# Patient Record
Sex: Male | Born: 1985 | Hispanic: No | Marital: Married | Smoking: Current some day smoker
Health system: Southern US, Community
[De-identification: ages and names within clinical notes are randomized; demographics above are authoritative.]

---

## 2015-01-17 ENCOUNTER — Emergency Department (HOSPITAL_COMMUNITY)
Admission: EM | Admit: 2015-01-17 | Discharge: 2015-01-17 | Disposition: A | Discharge status: Home / Self Care | Payer: PPO | Attending: Emergency Medicine | Admitting: Emergency Medicine

## 2015-01-17 ENCOUNTER — Emergency Department (HOSPITAL_COMMUNITY): Payer: PPO

## 2015-01-17 ENCOUNTER — Encounter (HOSPITAL_COMMUNITY): Payer: Self-pay | Admitting: Cardiology

## 2015-01-17 DIAGNOSIS — S60222A Contusion of left hand, initial encounter: Secondary | ICD-10-CM | POA: Diagnosis not present

## 2015-01-17 DIAGNOSIS — Y9366 Activity, soccer: Secondary | ICD-10-CM | POA: Diagnosis not present

## 2015-01-17 DIAGNOSIS — Y999 Unspecified external cause status: Secondary | ICD-10-CM | POA: Diagnosis not present

## 2015-01-17 DIAGNOSIS — W1839XA Other fall on same level, initial encounter: Secondary | ICD-10-CM | POA: Insufficient documentation

## 2015-01-17 DIAGNOSIS — Z720 Tobacco use: Secondary | ICD-10-CM | POA: Insufficient documentation

## 2015-01-17 DIAGNOSIS — S6992XA Unspecified injury of left wrist, hand and finger(s), initial encounter: Secondary | ICD-10-CM | POA: Diagnosis present

## 2015-01-17 DIAGNOSIS — Y92322 Soccer field as the place of occurrence of the external cause: Secondary | ICD-10-CM | POA: Diagnosis not present

## 2015-01-17 DIAGNOSIS — S62309A Unspecified fracture of unspecified metacarpal bone, initial encounter for closed fracture: Secondary | ICD-10-CM

## 2015-01-17 DIAGNOSIS — S62335A Displaced fracture of neck of fourth metacarpal bone, left hand, initial encounter for closed fracture: Secondary | ICD-10-CM | POA: Insufficient documentation

## 2015-01-17 DIAGNOSIS — M79642 Pain in left hand: Secondary | ICD-10-CM

## 2015-01-17 MED ORDER — NAPROXEN 500 MG PO TABS: 500.0000 mg | ORAL_TABLET | Freq: Two times a day (BID) | ORAL | Status: AC | PRN

## 2015-01-17 MED ORDER — HYDROCODONE-ACETAMINOPHEN 5-325 MG PO TABS: 1.0000 | ORAL_TABLET | Freq: Four times a day (QID) | ORAL | Status: AC | PRN

## 2015-01-17 MED ORDER — MORPHINE SULFATE (PF) 4 MG/ML IV SOLN
4.0000 mg | Freq: Once | INTRAVENOUS | Status: AC
  Administered 2015-01-17: 4 mg via INTRAMUSCULAR
  Filled 2015-01-17: qty 1

## 2015-01-17 NOTE — ED Provider Notes (Signed)
CSN: [id number]     Arrival date & time 01/17/15  1812 History  By signing my name below, I, Emmanuella Mensah, attest that this documentation has been prepared under the direction and in the presence of Meshelle Holness C. Electronically Signed: Angelene GiovanniEmmanuella Mensah, ED Scribe. 01/17/2015. 6:48 PM.    Chief Complaint  Patient presents with  . Hand Pain   Patient is a 29 y.o. male presenting with hand pain. The history is provided by the patient. No language interpreter was used.  Hand Pain This is a new problem. The current episode started less than 1 hour ago. The problem occurs constantly. The problem has been gradually worsening. The symptoms are aggravated by exertion (L hand movement). Nothing relieves the symptoms. He has tried nothing for the symptoms. The treatment provided no relief.   HPI Comments: Jason Johns is a 29 y.o. male who presents to the Emergency Department status post left hand injury that occurred approximately 30 minutes ago while playing soccer, states he fell on it. He reports constant throbbing,10/10 left hand pain that radiates up his left arm, swelling and bruising of the left hand, and left wrist pain. Pain worsens with movement of L hand, and he has not tried anything PTA for his symptoms. He denies any head injury. History is somewhat difficult due to pt repetitively interrupting to state he's in pain and needs something to help with his pain. He reports NKDA. He denies any numbness or tingling, denies weakness but states it hurts to move his L hand/fingers.   History reviewed. No pertinent past medical history. History reviewed. No pertinent past surgical history. History reviewed. No pertinent family history. Social History  Substance Use Topics  . Smoking status: Current Some Day Smoker  . Smokeless tobacco: None  . Alcohol Use: Yes    Review of Systems  HENT: Negative for facial swelling (no head inj).   Musculoskeletal: Positive for joint swelling and  arthralgias (L hand).  Skin: Positive for color change (bruising L hand). Negative for wound.  Allergic/Immunologic: Negative for immunocompromised state.  Neurological: Negative for weakness and numbness.    Allergies  Review of patient's allergies indicates no known allergies.  Home Medications   Prior to Admission medications   Not on File   BP 126/87 mmHg  Pulse 98  Temp(Src) 99.3 F (37.4 C) (Oral)  Resp 16  SpO2 97% Physical Exam  Constitutional: He is oriented to person, place, and time. Vital signs are normal. He appears well-developed and well-nourished.  Non-toxic appearance. He appears distressed (appears uncomfortable).  Afebrile, nontoxic, appears uncomfortable, repetitively stating he's in pain  HENT:  Head: Normocephalic and atraumatic.  Mouth/Throat: Mucous membranes are normal.  Eyes: Conjunctivae and EOM are normal. Right eye exhibits no discharge. Left eye exhibits no discharge.  Neck: Normal range of motion. Neck supple.  Cardiovascular: Normal rate and intact distal pulses.   Pulmonary/Chest: Effort normal. No respiratory distress.  Abdominal: Normal appearance. He exhibits no distension.  Musculoskeletal:       Left hand: He exhibits decreased range of motion (due to pain), tenderness, bony tenderness and swelling. He exhibits normal capillary refill and no laceration. Normal sensation noted. Decreased strength (due to pain) noted.       Hands: Left hand with bruising and swelling over the 4th and 5th metacarpals, diminished ROM due to pain, tenderness over the 4th and 5th metacarpals as well as the wrist, no forearm tenderness. Strength slightly diminished due to pain, but still able to  wiggle all digits. Sensation grossly intact, distal pulses intact. No skin openings.   Neurological: He is alert and oriented to person, place, and time. He has normal strength. No sensory deficit.  Skin: Skin is warm, dry and intact. Bruising noted. No rash noted.    Psychiatric: He has a normal mood and affect.  Nursing note and vitals reviewed.   ED Course  Procedures (including critical care time) DIAGNOSTIC STUDIES: Oxygen Saturation is 97% on RA, adequate by my interpretation.    COORDINATION OF CARE: 6:43 PM- Pt advised of plan for treatment and pt agrees. Pt will receive pain medication and ice as well as an X-ray.    Imaging Review Dg Wrist Complete Left  01/17/2015  CLINICAL DATA:  Acute left hand pain after falling while playing soccer. Initial encounter. EXAM: LEFT WRIST - COMPLETE 3+ VIEW COMPARISON:  None. FINDINGS: Mildly displaced oblique fracture is seen involving the proximal portion of the fourth metacarpal. This appears to be closed and posttraumatic. No other bony abnormality is noted. Joint spaces are intact. No soft tissue abnormality is noted. IMPRESSION: Mildly displaced proximal fourth metacarpal fracture. Electronically Signed   By: Lupita Raider, M.D.   On: 01/17/2015 19:05   Dg Hand Complete Left  01/17/2015  CLINICAL DATA:  Fall while playing soccer with swelling and pain to left hand. EXAM: LEFT HAND - COMPLETE 3+ VIEW COMPARISON:  None. FINDINGS: There is a minimally displaced oblique fracture through the proximal to mid aspect of the fourth metacarpal. Remainder of the exam is within normal. IMPRESSION: Minimally displaced oblique fracture of the proximal to mid aspect of the fourth metacarpal. Electronically Signed   By: Elberta Fortis M.D.   On: 01/17/2015 19:04   Rosabelle Jupin Camprubi-Soms, PA has personally reviewed and evaluated these images as part of her medical decision-making.   EKG Interpretation None      MDM   Final diagnoses:  Metacarpal bone fracture, closed, initial encounter  Hand pain, left    29 y.o. male here with L hand pain after soccer injury, swelling and bruising noted to dorsum of hand, NVI with soft compartments. No skin injury. Will obtain xrays and give IM morphine for pain since pt is  in severe pain and want to avoid PO meds until we can determine if this is a fracture that would need OR repair. Will reassess shortly.   7:29 PM Xray reveals mildly displaced 4th metacarpal fx, will place in ulnar gutter splint and refer to hand specialist in 3-5 days for ongoing management. Pain meds given. I explained the diagnosis and have given explicit precautions to return to the ER including for any other new or worsening symptoms. The patient understands and accepts the medical plan as it's been dictated and I have answered their questions. Discharge instructions concerning home care and prescriptions have been given. The patient is STABLE and is discharged to home in good condition.   I personally performed the services described in this documentation, which was scribed in my presence. The recorded information has been reviewed and is accurate.  BP 126/87 mmHg  Pulse 98  Temp(Src) 99.3 F (37.4 C) (Oral)  Resp 16  SpO2 97%  Meds ordered this encounter  Medications  . morphine 4 MG/ML injection 4 mg    Sig:   . naproxen (NAPROSYN) 500 MG tablet    Sig: Take 1 tablet (500 mg total) by mouth 2 (two) times daily as needed for mild pain, moderate pain or headache (TAKE  WITH MEALS.).    Dispense:  20 tablet    Refill:  0    Order Specific Question:  Supervising Provider    Answer:  MILLER, BRIAN [[id number]]  . HYDROcodone-acetaminophen (NORCO) 5-325 MG tablet    Sig: Take 1 tablet by mouth every 6 (six) hours as needed for severe pain.    Dispense:  15 tablet    Refill:  0    Order Specific Question:  Supervising Provider    Answer:  Eber Hong [[id number]]     Dayna Alia Camprubi-Soms, PA-C 01/17/15 1929  Donnetta Hutching, MD 01/17/15 [phone]

## 2015-01-17 NOTE — ED Notes (Signed)
Ortho at bedside.

## 2015-01-17 NOTE — ED Notes (Signed)
Pt stable, ambulatory, states understanding of discharge instructions 

## 2015-01-17 NOTE — Progress Notes (Signed)
Orthopedic Tech Progress Note Patient Details:  Jason Johns 03/21/1986 [id number]  Ortho Devices Type of Ortho Device: Ace wrap, Ulna gutter splint Ortho Device/Splint Location: LUE Ortho Device/Splint Interventions: Ordered, Application   Jennye MoccasinHughes, [name] 01/17/2015, 7:48 PM

## 2015-01-17 NOTE — Discharge Instructions (Signed)
Wear wrist splint at all times. Ice and elevate hand throughout the day. Alternate between naprosyn and norco for pain relief. Do not drive or operate machinery with pain medication use. Call hand specialist follow up today or tomorrow to schedule followup appointment in 3-5 days for ongoing management of your hand fracture. Return to the ER for changes or worsening symptoms.    Metacarpal Fracture A metacarpal fracture is a break (fracture) of a bone in the hand. Metacarpals are the bones that extend from your knuckles to your wrist. In each hand, you have five metacarpal bones that connect your fingers and your thumb to your wrist. Some hand fractures have bone pieces that are close together and stable (simple). These fractures may be treated with only a splint or cast. Hand fractures that have many pieces of broken bone (comminuted), unstable bone pieces (displaced), or a bone that breaks through the skin (compound) usually require surgery. CAUSES This injury may be caused by:  A fall.  A hard, direct hit to your hand.  An injury that squeezes your knuckle, stretches your finger out of place, or crushes your hand. RISK FACTORS This injury is more likely to occur if:  You play contact sports.  You have certain bone diseases. SYMPTOMS  Symptoms of this type of fracture develop soon after the injury. Symptoms may include:  Swelling.  Pain.  Stiffness.  Increased pain with movement.  Bruising.  Inability to move a finger.  A shortened finger.  A finger knuckle that looks sunken in.  Unusual appearance of the hand or finger (deformity). DIAGNOSIS  This injury may be diagnosed based on your signs and symptoms, especially if you had a recent hand injury. Your health care provider will perform a physical exam. He or she may also order X-rays to confirm the diagnosis.  TREATMENT  Treatment for this injury depends on the type of fracture you have and how severe it is. Possible  treatments include:  Non-reduction. This can be done if the bone does not need to be moved back into place. The fracture can be casted or splinted as it is.   Closed reduction. If your bone is stable and can be moved back into place, you may only need to wear a cast or splint or have buddy taping.  Closed reduction with internal fixation (CRIF). This is the most common treatment. You may have this procedure if your bone can be moved back into place but needs more support. Wires, pins, or screws may be inserted through your skin to stabilize the fracture.  Open reduction with internal fixation (ORIF). This may be needed if your fracture is severe and unstable. It involves surgery to move your bone back into the right position. Screws, wires, or plates are used to stabilize the fracture. After all procedures, you may need to wear a cast or a splint for several weeks. You will also need to have follow-up X-rays to make sure that the bone is healing well and staying in position. After you no longer need your cast or splint, you may need physical therapy. This will help you to regain full movement and strength in your hand.  HOME CARE INSTRUCTIONS  If You Have a Cast:  Do not stick anything inside the cast to scratch your skin. Doing that increases your risk of infection.  Check the skin around the cast every day. Report any concerns to your health care provider. You may put lotion on dry skin around the edges  of the cast. Do not apply lotion to the skin underneath the cast. If You Have a Splint:  Wear it as directed by your health care provider. Remove it only as directed by your health care provider.  Loosen the splint if your fingers become numb and tingle, or if they turn cold and blue. Bathing  Cover the cast or splint with a watertight plastic bag to protect it from water while you take a bath or a shower. Do not let the cast or splint get wet. Managing Pain, Stiffness, and Swelling  If  directed, apply ice to the injured area (if you have a splint, not a cast):  Put ice in a plastic bag.  Place a towel between your skin and the bag.  Leave the ice on for 20 minutes, 2-3 times a day.  Move your fingers often to avoid stiffness and to lessen swelling.  Raise the injured area above the level of your heart while you are sitting or lying down. Driving  Do not drive or operate heavy machinery while taking pain medicine.  Do not drive while wearing a cast or splint on a hand that you use for driving. Activity  Return to your normal activities as directed by your health care provider. Ask your health care provider what activities are safe for you. General Instructions  Do not put pressure on any part of the cast or splint until it is fully hardened. This may take several hours.  Keep the cast or splint clean and dry.  Do not use any tobacco products, including cigarettes, chewing tobacco, or electronic cigarettes. Tobacco can delay bone healing. If you need help quitting, ask your health care provider.  Take medicines only as directed by your health care provider.  Keep all follow-up visits as directed by your health care provider. This is important. SEEK MEDICAL CARE IF:   Your pain is getting worse.  You have redness, swelling, or pain in the injured area.   You have fluid, blood, or pus coming from under your cast or splint.   You notice a bad smell coming from under your cast or splint.   You have a fever.  SEEK IMMEDIATE MEDICAL CARE IF:   You develop a rash.   You have trouble breathing.   Your skin or nails on your injured hand turn blue or gray even after you loosen your splint.  Your injured hand feels cold or becomes numb even after you loosen your splint.   You develop severe pain under the cast or in your hand.   This information is not intended to replace advice given to you by your health care provider. Make sure you discuss any  questions you have with your health care provider.   Document Released: 03/15/2005 Document Revised: 12/04/2014 Document Reviewed: 01/02/2014 Elsevier Interactive Patient Education 2016 Else[employer]  Cryotherapy Cryotherapy means treatment with cold. Ice or gel packs can be used to reduce both pain and swelling. Ice is the most helpful within the first 24 to 48 hours after an injury or flare-up from overusing a muscle or joint. Sprains, strains, spasms, burning pain, shooting pain, and aches can all be eased with ice. Ice can also be used when recovering from surgery. Ice is effective, has very few side effects, and is safe for most people to use. PRECAUTIONS  Ice is not a safe treatment option for people with:  Raynaud phenomenon. This is a condition affecting small blood vessels in the extremities. Exposure  to cold may cause your problems to return.  Cold hypersensitivity. There are many forms of cold hypersensitivity, including:  Cold urticaria. Red, itchy hives appear on the skin when the tissues begin to warm after being iced.  Cold erythema. This is a red, itchy rash caused by exposure to cold.  Cold hemoglobinuria. Red blood cells break down when the tissues begin to warm after being iced. The hemoglobin that carry oxygen are passed into the urine because they cannot combine with blood proteins fast enough.  Numbness or altered sensitivity in the area being iced. If you have any of the following conditions, do not use ice until you have discussed cryotherapy with your caregiver:  Heart conditions, such as arrhythmia, angina, or chronic heart disease.  High blood pressure.  Healing wounds or open skin in the area being iced.  Current infections.  Rheumatoid arthritis.  Poor circulation.  Diabetes. Ice slows the blood flow in the region it is applied. This is beneficial when trying to stop inflamed tissues from spreading irritating chemicals to surrounding tissues. However,  if you expose your skin to cold temperatures for too long or without the proper protection, you can damage your skin or nerves. Watch for signs of skin damage due to cold. HOME CARE INSTRUCTIONS Follow these tips to use ice and cold packs safely.  Place a dry or damp towel between the ice and skin. A damp towel will cool the skin more quickly, so you may need to shorten the time that the ice is used.  For a more rapid response, add gentle compression to the ice.  Ice for no more than 10 to 20 minutes at a time. The bonier the area you are icing, the less time it will take to get the benefits of ice.  Check your skin after 5 minutes to make sure there are no signs of a poor response to cold or skin damage.  Rest 20 minutes or more between uses.  Once your skin is numb, you can end your treatment. You can test numbness by very lightly touching your skin. The touch should be so light that you do not see the skin dimple from the pressure of your fingertip. When using ice, most people will feel these normal sensations in this order: cold, burning, aching, and numbness.  Do not use ice on someone who cannot communicate their responses to pain, such as small children or people with dementia. HOW TO MAKE AN ICE PACK Ice packs are the most common way to use ice therapy. Other methods include ice massage, ice baths, and cryosprays. Muscle creams that cause a cold, tingly feeling do not offer the same benefits that ice offers and should not be used as a substitute unless recommended by your caregiver. To make an ice pack, do one of the following:  Place crushed ice or a bag of frozen vegetables in a sealable plastic bag. Squeeze out the excess air. Place this bag inside another plastic bag. Slide the bag into a pillowcase or place a damp towel between your skin and the bag.  Mix 3 parts water with 1 part rubbing alcohol. Freeze the mixture in a sealable plastic bag. When you remove the mixture from the  freezer, it will be slushy. Squeeze out the excess air. Place this bag inside another plastic bag. Slide the bag into a pillowcase or place a damp towel between your skin and the bag. SEEK MEDICAL CARE IF:  You develop white spots on your  skin. This may give the skin a blotchy (mottled) appearance.  Your skin turns blue or pale.  Your skin becomes waxy or hard.  Your swelling gets worse. MAKE SURE YOU:   Understand these instructions.  Will watch your condition.  Will get help right away if you are not doing well or get worse.   This information is not intended to replace advice given to you by your health care provider. Make sure you discuss any questions you have with your health care provider.   Document Released: 11/09/2010 Document Revised: 04/05/2014 Document Reviewed: 11/09/2010 Elsevier Interactive Patient Education 2016 Else[employer] Inc.  Cast or Splint Care Casts and splints support injured limbs and keep bones from moving while they heal.  HOME CARE  Keep the cast or splint uncovered during the drying period.  A plaster cast can take 24 to 48 hours to dry.  A fiberglass cast will dry in less than 1 hour.  Do not rest the cast on anything harder than a pillow for 24 hours.  Do not put weight on your injured limb. Do not put pressure on the cast. Wait for your doctor's approval.  Keep the cast or splint dry.  Cover the cast or splint with a plastic bag during baths or wet weather.  If you have a cast over your chest and belly (trunk), take sponge baths until the cast is taken off.  If your cast gets wet, dry it with a towel or blow dryer. Use the cool setting on the blow dryer.  Keep your cast or splint clean. Wash a dirty cast with a damp cloth.  Do not put any objects under your cast or splint.  Do not scratch the skin under the cast with an object. If itching is a problem, use a blow dryer on a cool setting over the itchy area.  Do not trim or cut your  cast.  Do not take out the padding from inside your cast.  Exercise your joints near the cast as told by your doctor.  Raise (elevate) your injured limb on 1 or 2 pillows for the first 1 to 3 days. GET HELP IF:  Your cast or splint cracks.  Your cast or splint is too tight or too loose.  You itch badly under the cast.  Your cast gets wet or has a soft spot.  You have a bad smell coming from the cast.  You get an object stuck under the cast.  Your skin around the cast becomes red or sore.  You have new or more pain after the cast is put on. GET HELP RIGHT AWAY IF:  You have fluid leaking through the cast.  You cannot move your fingers or toes.  Your fingers or toes turn blue or white or are cool, painful, or puffy (swollen).  You have tingling or lose feeling (numbness) around the injured area.  You have bad pain or pressure under the cast.  You have trouble breathing or have shortness of breath.  You have chest pain.   This information is not intended to replace advice given to you by your health care provider. Make sure you discuss any questions you have with your health care provider.   Document Released: 07/15/2010 Document Revised: 11/15/2012 Document Reviewed: 09/21/2012 Elsevier Interactive Patient Education [employer]

## 2015-01-17 NOTE — ED Notes (Signed)
Reports left hand pain after falling on it while playing soccer. Hand is swollen and painful to move.

## 2015-01-17 NOTE — ED Notes (Signed)
[facility] at [facility] requesting [facility] sling for arm, this RN got verbal order for arm sling from PA. Applied at [facility].

## 2016-12-27 IMAGING — DX DG WRIST COMPLETE 3+V*L*
4 series · 4 of 4 positions shown · non-contrast
Comparison: None.

CLINICAL DATA: Acute left hand pain after falling while playing
soccer. Initial encounter.

EXAM:
LEFT WRIST - COMPLETE 3+ VIEW

[x wrist pa left]
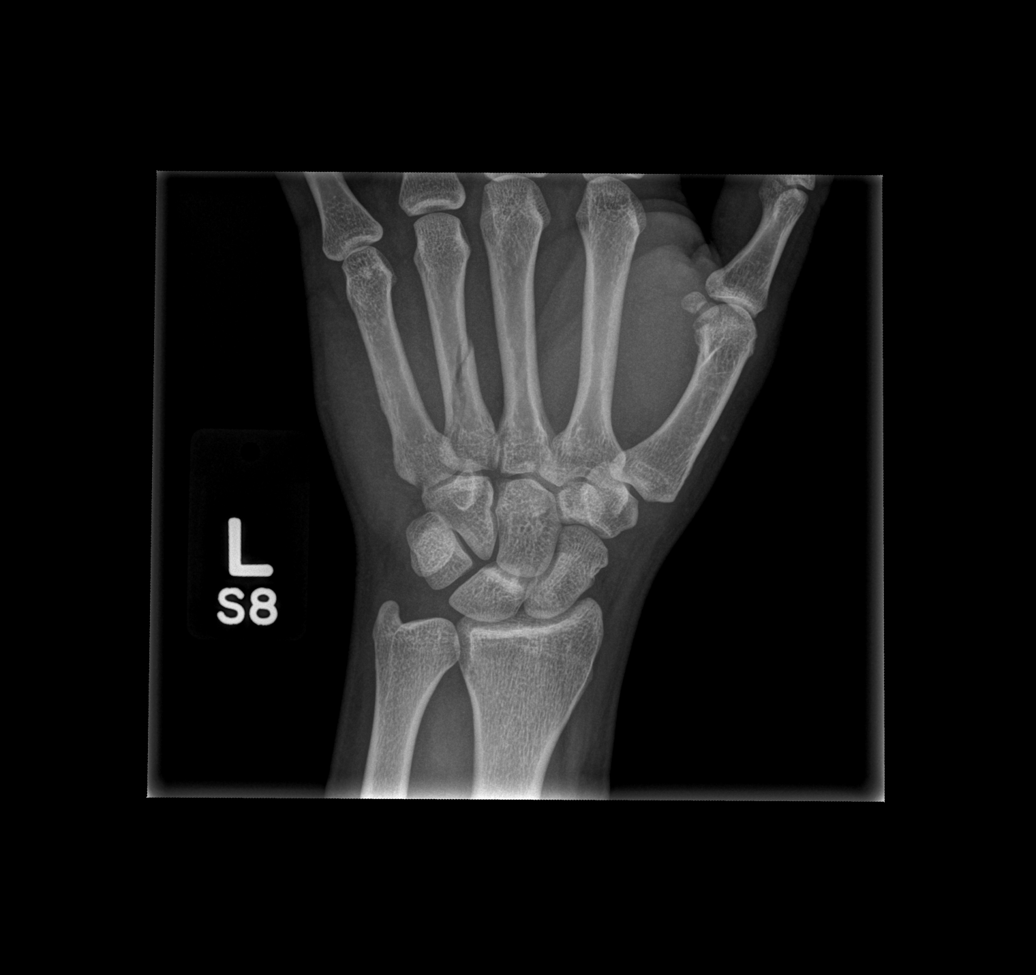

[x wrist obl left]
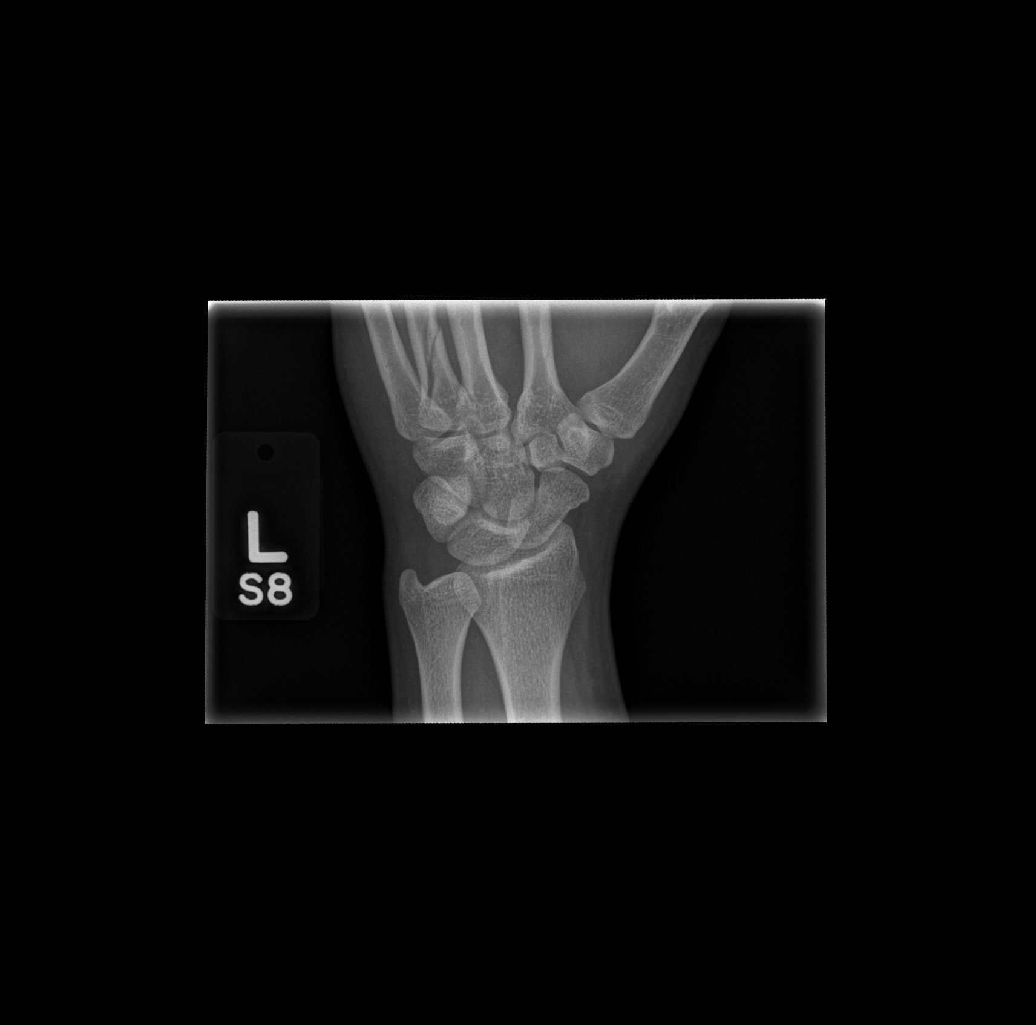

[x wrist lat left]
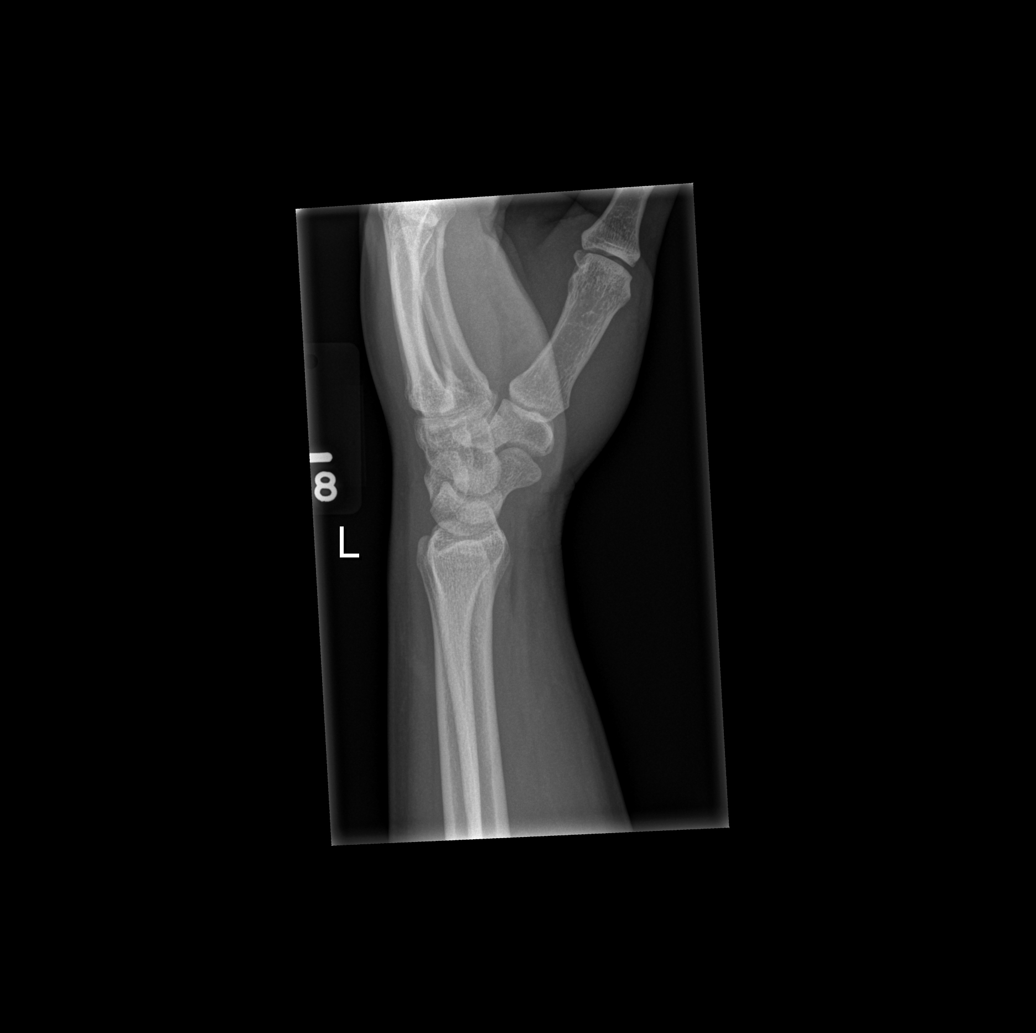

[x wrist navicular view left]
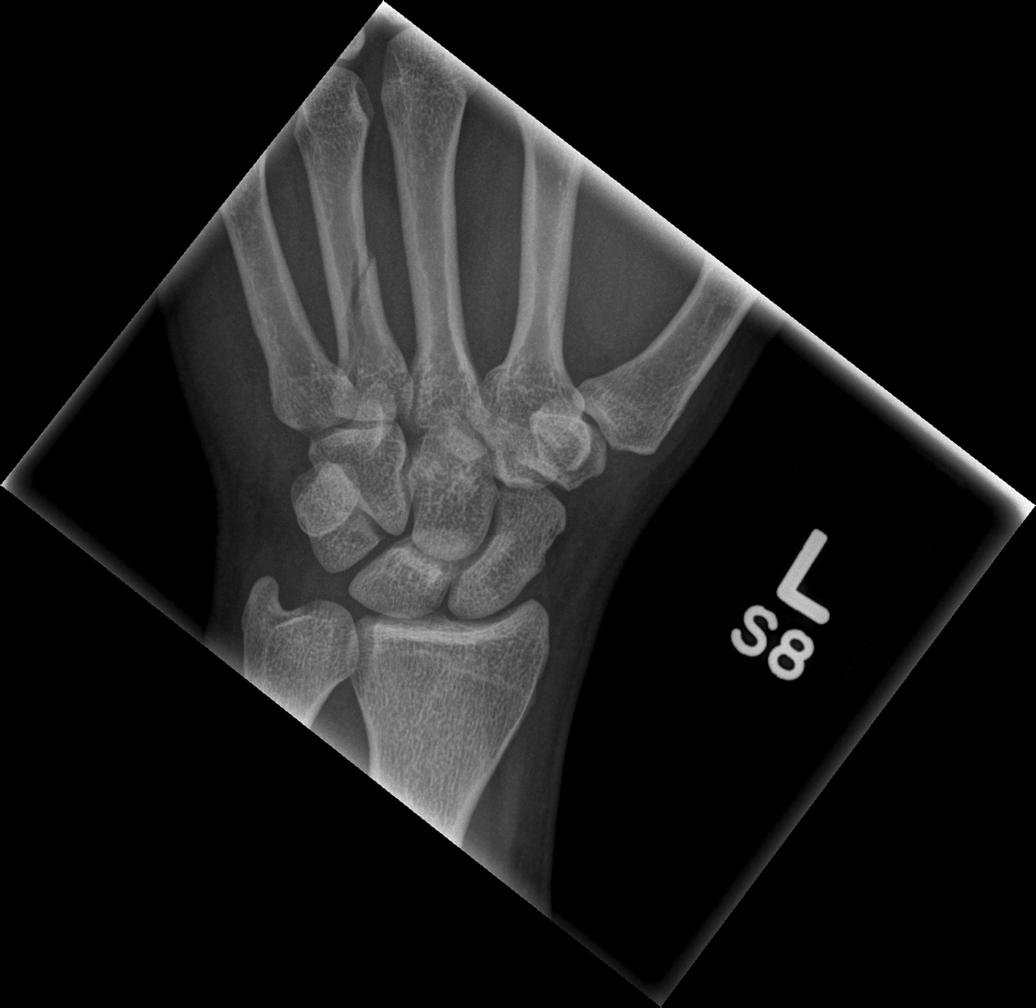

[4 of 4 positions shown; findings below may reference images not displayed]

FINDINGS: Mildly displaced oblique fracture is seen involving the proximal
portion of the fourth metacarpal. This appears to be closed and
posttraumatic. No other bony abnormality is noted. Joint spaces are
intact. No soft tissue abnormality is noted.
IMPRESSION: Mildly displaced proximal fourth metacarpal fracture.
